# Patient Record
Sex: Male | Born: 2005 | Hispanic: Yes | Marital: Single
Health system: Southern US, Community
[De-identification: ages and names within clinical notes are randomized; demographics above are authoritative.]

---
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
## 2023-01-15 ENCOUNTER — Other Ambulatory Visit: Payer: Self-pay

## 2023-01-15 ENCOUNTER — Inpatient Hospital Stay (HOSPITAL_COMMUNITY)
Admission: EM | Admit: 2023-01-15 | Discharge: 2023-01-17 | DRG: 399 | Disposition: A | Discharge status: Home / Self Care | Payer: Self-pay | Attending: Surgery | Admitting: Surgery

## 2023-01-15 ENCOUNTER — Encounter (HOSPITAL_COMMUNITY): Payer: Self-pay | Admitting: Emergency Medicine

## 2023-01-15 DIAGNOSIS — Z91013 Allergy to seafood: Secondary | ICD-10-CM

## 2023-01-15 DIAGNOSIS — K3580 Unspecified acute appendicitis: Principal | ICD-10-CM

## 2023-01-15 DIAGNOSIS — K660 Peritoneal adhesions (postprocedural) (postinfection): Secondary | ICD-10-CM | POA: Diagnosis present

## 2023-01-15 DIAGNOSIS — K37 Unspecified appendicitis: Secondary | ICD-10-CM | POA: Diagnosis present

## 2023-01-15 DIAGNOSIS — K35211 Acute appendicitis with generalized peritonitis, with perforation and abscess: Principal | ICD-10-CM | POA: Diagnosis present

## 2023-01-15 LAB — COMPREHENSIVE METABOLIC PANEL
ALT: 23 U/L (ref 0–44)
AST: 23 U/L (ref 15–41)
Albumin: 4.3 g/dL (ref 3.5–5.0)
Alkaline Phosphatase: 61 U/L (ref 38–126)
Anion gap: 11 (ref 5–15)
BUN: 8 mg/dL (ref 6–20)
CO2: 24 mmol/L (ref 22–32)
Calcium: 8.9 mg/dL (ref 8.9–10.3)
Chloride: 98 mmol/L (ref 98–111)
Creatinine, Ser: 0.95 mg/dL (ref 0.61–1.24)
GFR, Estimated: >60 mL/min (ref >60)
Glucose, Bld: 104 mg/dL — ABNORMAL HIGH (ref 70–99)
Potassium: 3.7 mmol/L (ref 3.5–5.1)
Sodium: 133 mmol/L — ABNORMAL LOW (ref 135–145)
Total Bilirubin: 1.3 mg/dL — ABNORMAL HIGH (ref 0.3–1.2)
Total Protein: 7.6 g/dL (ref 6.5–8.1)

## 2023-01-15 LAB — URINALYSIS, ROUTINE W REFLEX MICROSCOPIC
Bilirubin Urine: NEGATIVE
Glucose, UA: NEGATIVE mg/dL
Hgb urine dipstick: NEGATIVE
Ketones, ur: NEGATIVE mg/dL
Leukocytes,Ua: NEGATIVE
Nitrite: NEGATIVE
Protein, ur: NEGATIVE mg/dL
Specific Gravity, Urine: 1.014 (ref 1.005–1.030)
pH: 6 (ref 5.0–8.0)

## 2023-01-15 LAB — LIPASE, BLOOD: Lipase: 28 U/L (ref 11–51)

## 2023-01-15 LAB — CBC
HCT: 46.7 % (ref 39.0–52.0)
Hemoglobin: 16 g/dL (ref 13.0–17.0)
MCH: 30.7 pg (ref 26.0–34.0)
MCHC: 34.3 g/dL (ref 30.0–36.0)
MCV: 89.6 fL (ref 80.0–100.0)
Platelets: 214 10*3/uL (ref 150–400)
RBC: 5.21 MIL/uL (ref 4.22–5.81)
RDW: 12 % (ref 11.5–15.5)
WBC: 17.2 10*3/uL — ABNORMAL HIGH (ref 4.0–10.5)
nRBC: 0 % (ref 0.0–0.2)

## 2023-01-15 NOTE — ED Triage Notes (Signed)
Pt in with cramping lower abdominal pain x 2 days. Worse today, denies n/v/d

## 2023-01-16 ENCOUNTER — Encounter (HOSPITAL_COMMUNITY): Admission: EM | Disposition: A | Discharge status: Home / Self Care | Payer: Self-pay | Source: Home / Self Care

## 2023-01-16 ENCOUNTER — Observation Stay (HOSPITAL_COMMUNITY): Payer: Self-pay | Admitting: Anesthesiology

## 2023-01-16 ENCOUNTER — Encounter (HOSPITAL_COMMUNITY): Payer: Self-pay | Admitting: General Surgery

## 2023-01-16 ENCOUNTER — Emergency Department (HOSPITAL_COMMUNITY): Payer: Self-pay

## 2023-01-16 DIAGNOSIS — K3533 Acute appendicitis with perforation and localized peritonitis, with abscess: Secondary | ICD-10-CM

## 2023-01-16 DIAGNOSIS — K37 Unspecified appendicitis: Secondary | ICD-10-CM | POA: Diagnosis present

## 2023-01-16 HISTORY — PX: LAPAROSCOPIC APPENDECTOMY: SHX408

## 2023-01-16 LAB — CBC
HCT: 41.4 % (ref 39.0–52.0)
Hemoglobin: 14.5 g/dL (ref 13.0–17.0)
MCH: 30.9 pg (ref 26.0–34.0)
MCHC: 35 g/dL (ref 30.0–36.0)
MCV: 88.1 fL (ref 80.0–100.0)
Platelets: 160 10*3/uL (ref 150–400)
RBC: 4.7 MIL/uL (ref 4.22–5.81)
RDW: 12 % (ref 11.5–15.5)
WBC: 9.8 10*3/uL (ref 4.0–10.5)
nRBC: 0 % (ref 0.0–0.2)

## 2023-01-16 LAB — BASIC METABOLIC PANEL
Anion gap: 12 (ref 5–15)
BUN: 7 mg/dL (ref 6–20)
CO2: 19 mmol/L — ABNORMAL LOW (ref 22–32)
Calcium: 8.3 mg/dL — ABNORMAL LOW (ref 8.9–10.3)
Chloride: 102 mmol/L (ref 98–111)
Creatinine, Ser: 0.8 mg/dL (ref 0.61–1.24)
GFR, Estimated: >60 mL/min (ref >60)
Glucose, Bld: 98 mg/dL (ref 70–99)
Potassium: 3.5 mmol/L (ref 3.5–5.1)
Sodium: 133 mmol/L — ABNORMAL LOW (ref 135–145)

## 2023-01-16 LAB — HIV ANTIBODY (ROUTINE TESTING W REFLEX): HIV Screen 4th Generation wRfx: NONREACTIVE

## 2023-01-16 SURGERY — APPENDECTOMY, LAPAROSCOPIC
Anesthesia: General

## 2023-01-16 MED ORDER — DIPHENHYDRAMINE HCL 25 MG PO CAPS: 25.0000 mg | ORAL_CAPSULE | Freq: Four times a day (QID) | ORAL | Status: DC | PRN

## 2023-01-16 MED ORDER — ONDANSETRON HCL 4 MG/2ML IJ SOLN
INTRAMUSCULAR | Status: DC | PRN
  Administered 2023-01-16: 4 mg via INTRAVENOUS

## 2023-01-16 MED ORDER — DEXMEDETOMIDINE HCL IN NACL 80 MCG/20ML IV SOLN
INTRAVENOUS | Status: AC
  Filled 2023-01-16: qty 20

## 2023-01-16 MED ORDER — DEXAMETHASONE SODIUM PHOSPHATE 10 MG/ML IJ SOLN
INTRAMUSCULAR | Status: AC
  Filled 2023-01-16: qty 1

## 2023-01-16 MED ORDER — BUPIVACAINE-EPINEPHRINE (PF) 0.25% -1:200000 IJ SOLN
INTRAMUSCULAR | Status: AC
  Filled 2023-01-16: qty 30

## 2023-01-16 MED ORDER — SODIUM CHLORIDE 0.9 % IV SOLN
2.0000 g | Freq: Once | INTRAVENOUS | Status: AC
  Administered 2023-01-16: 2 g via INTRAVENOUS
  Filled 2023-01-16: qty 20

## 2023-01-16 MED ORDER — ONDANSETRON HCL 4 MG/2ML IJ SOLN: 4.0000 mg | Freq: Four times a day (QID) | INTRAMUSCULAR | Status: DC | PRN

## 2023-01-16 MED ORDER — IOHEXOL 350 MG/ML SOLN
75.0000 mL | Freq: Once | INTRAVENOUS | Status: AC | PRN
  Administered 2023-01-16: 75 mL via INTRAVENOUS

## 2023-01-16 MED ORDER — MIDAZOLAM HCL 2 MG/2ML IJ SOLN
INTRAMUSCULAR | Status: AC
  Filled 2023-01-16: qty 2

## 2023-01-16 MED ORDER — ROCURONIUM BROMIDE 10 MG/ML (PF) SYRINGE
PREFILLED_SYRINGE | INTRAVENOUS | Status: DC | PRN
  Administered 2023-01-16: 60 mg via INTRAVENOUS

## 2023-01-16 MED ORDER — FENTANYL CITRATE (PF) 100 MCG/2ML IJ SOLN
25.0000 ug | INTRAMUSCULAR | Status: DC | PRN
  Administered 2023-01-16: 25 ug via INTRAVENOUS

## 2023-01-16 MED ORDER — LACTATED RINGERS IV BOLUS: 1000.0000 mL | Freq: Once | INTRAVENOUS | Status: DC

## 2023-01-16 MED ORDER — AMOXICILLIN-POT CLAVULANATE 875-125 MG PO TABS
1.0000 | ORAL_TABLET | Freq: Two times a day (BID) | ORAL | Status: DC
  Administered 2023-01-16 – 2023-01-17 (×2): 1 via ORAL
  Filled 2023-01-16 (×2): qty 1

## 2023-01-16 MED ORDER — PROPOFOL 10 MG/ML IV BOLUS
INTRAVENOUS | Status: AC
  Filled 2023-01-16: qty 20

## 2023-01-16 MED ORDER — DEXMEDETOMIDINE HCL IN NACL 80 MCG/20ML IV SOLN
INTRAVENOUS | Status: DC | PRN
  Administered 2023-01-16: 4 ug via INTRAVENOUS
  Administered 2023-01-16: 8 ug via INTRAVENOUS

## 2023-01-16 MED ORDER — FENTANYL CITRATE (PF) 250 MCG/5ML IJ SOLN
INTRAMUSCULAR | Status: DC | PRN
  Administered 2023-01-16 (×3): 50 ug via INTRAVENOUS

## 2023-01-16 MED ORDER — HYDROMORPHONE HCL 1 MG/ML IJ SOLN: 0.5000 mg | INTRAMUSCULAR | Status: DC | PRN

## 2023-01-16 MED ORDER — OXYCODONE HCL 5 MG PO TABS
5.0000 mg | ORAL_TABLET | Freq: Four times a day (QID) | ORAL | Status: DC | PRN
  Administered 2023-01-16 – 2023-01-17 (×2): 10 mg via ORAL
  Filled 2023-01-16 (×2): qty 2

## 2023-01-16 MED ORDER — ACETAMINOPHEN 325 MG PO TABS
650.0000 mg | ORAL_TABLET | Freq: Four times a day (QID) | ORAL | Status: DC | PRN
  Administered 2023-01-16: 650 mg via ORAL
  Filled 2023-01-16: qty 2

## 2023-01-16 MED ORDER — SUGAMMADEX SODIUM 200 MG/2ML IV SOLN
INTRAVENOUS | Status: DC | PRN
  Administered 2023-01-16: 100 mg via INTRAVENOUS
  Administered 2023-01-16: 50 mg via INTRAVENOUS
  Administered 2023-01-16: 100 mg via INTRAVENOUS

## 2023-01-16 MED ORDER — ACETAMINOPHEN 650 MG RE SUPP: 650.0000 mg | Freq: Four times a day (QID) | RECTAL | Status: DC | PRN

## 2023-01-16 MED ORDER — METRONIDAZOLE 500 MG/100ML IV SOLN
500.0000 mg | Freq: Once | INTRAVENOUS | Status: AC
  Administered 2023-01-16: 500 mg via INTRAVENOUS
  Filled 2023-01-16: qty 100

## 2023-01-16 MED ORDER — ORAL CARE MOUTH RINSE
15.0000 mL | Freq: Once | OROMUCOSAL | Status: AC
  Administered 2023-01-16: 15 mL via OROMUCOSAL

## 2023-01-16 MED ORDER — OXYCODONE HCL 5 MG/5ML PO SOLN: 5.0000 mg | Freq: Once | ORAL | Status: DC | PRN

## 2023-01-16 MED ORDER — DIPHENHYDRAMINE HCL 50 MG/ML IJ SOLN: 25.0000 mg | Freq: Four times a day (QID) | INTRAMUSCULAR | Status: DC | PRN

## 2023-01-16 MED ORDER — ONDANSETRON HCL 4 MG/2ML IJ SOLN
4.0000 mg | Freq: Once | INTRAMUSCULAR | Status: AC
  Administered 2023-01-16: 4 mg via INTRAVENOUS
  Filled 2023-01-16: qty 2

## 2023-01-16 MED ORDER — 0.9 % SODIUM CHLORIDE (POUR BTL) OPTIME
TOPICAL | Status: DC | PRN
  Administered 2023-01-16: 1000 mL

## 2023-01-16 MED ORDER — FENTANYL CITRATE (PF) 250 MCG/5ML IJ SOLN
INTRAMUSCULAR | Status: AC
  Filled 2023-01-16: qty 5

## 2023-01-16 MED ORDER — MIDAZOLAM HCL 2 MG/2ML IJ SOLN
INTRAMUSCULAR | Status: DC | PRN
  Administered 2023-01-16 (×2): 1 mg via INTRAVENOUS

## 2023-01-16 MED ORDER — ACETAMINOPHEN 325 MG PO TABS
650.0000 mg | ORAL_TABLET | Freq: Four times a day (QID) | ORAL | Status: DC
  Administered 2023-01-16 – 2023-01-17 (×3): 650 mg via ORAL
  Filled 2023-01-16 (×3): qty 2

## 2023-01-16 MED ORDER — ONDANSETRON 4 MG PO TBDP: 4.0000 mg | ORAL_TABLET | Freq: Four times a day (QID) | ORAL | Status: DC | PRN

## 2023-01-16 MED ORDER — ONDANSETRON HCL 4 MG/2ML IJ SOLN
INTRAMUSCULAR | Status: AC
  Filled 2023-01-16: qty 2

## 2023-01-16 MED ORDER — DEXAMETHASONE SODIUM PHOSPHATE 10 MG/ML IJ SOLN
INTRAMUSCULAR | Status: DC | PRN
  Administered 2023-01-16: 5 mg via INTRAVENOUS

## 2023-01-16 MED ORDER — LACTATED RINGERS IV SOLN: INTRAVENOUS | Status: DC

## 2023-01-16 MED ORDER — ONDANSETRON HCL 4 MG/2ML IJ SOLN: 4.0000 mg | Freq: Once | INTRAMUSCULAR | Status: DC | PRN

## 2023-01-16 MED ORDER — MEPERIDINE HCL 25 MG/ML IJ SOLN: 6.2500 mg | INTRAMUSCULAR | Status: DC | PRN

## 2023-01-16 MED ORDER — OXYCODONE HCL 5 MG PO TABS
5.0000 mg | ORAL_TABLET | ORAL | Status: DC | PRN
  Administered 2023-01-16: 5 mg via ORAL
  Filled 2023-01-16: qty 1

## 2023-01-16 MED ORDER — ACETAMINOPHEN 325 MG PO TABS: 325.0000 mg | ORAL_TABLET | ORAL | Status: DC | PRN

## 2023-01-16 MED ORDER — SODIUM CHLORIDE 0.9 % IV BOLUS
1000.0000 mL | Freq: Once | INTRAVENOUS | Status: AC
  Administered 2023-01-16: 1000 mL via INTRAVENOUS

## 2023-01-16 MED ORDER — OXYCODONE HCL 5 MG PO TABS: 5.0000 mg | ORAL_TABLET | Freq: Once | ORAL | Status: DC | PRN

## 2023-01-16 MED ORDER — CHLORHEXIDINE GLUCONATE 0.12 % MT SOLN: 15.0000 mL | Freq: Once | OROMUCOSAL | Status: AC

## 2023-01-16 MED ORDER — MORPHINE SULFATE (PF) 4 MG/ML IV SOLN
4.0000 mg | INTRAVENOUS | Status: DC | PRN
  Administered 2023-01-16: 4 mg via INTRAVENOUS
  Filled 2023-01-16: qty 1

## 2023-01-16 MED ORDER — DOCUSATE SODIUM 100 MG PO CAPS
100.0000 mg | ORAL_CAPSULE | Freq: Two times a day (BID) | ORAL | Status: DC
  Administered 2023-01-16 – 2023-01-17 (×3): 100 mg via ORAL
  Filled 2023-01-16 (×3): qty 1

## 2023-01-16 MED ORDER — ROCURONIUM BROMIDE 10 MG/ML (PF) SYRINGE
PREFILLED_SYRINGE | INTRAVENOUS | Status: AC
  Filled 2023-01-16: qty 10

## 2023-01-16 MED ORDER — BUPIVACAINE-EPINEPHRINE 0.25% -1:200000 IJ SOLN
INTRAMUSCULAR | Status: DC | PRN
  Administered 2023-01-16: 16 mL

## 2023-01-16 MED ORDER — TRAMADOL HCL 50 MG PO TABS: 50.0000 mg | ORAL_TABLET | Freq: Four times a day (QID) | ORAL | Status: DC | PRN

## 2023-01-16 MED ORDER — METRONIDAZOLE 500 MG/100ML IV SOLN
500.0000 mg | Freq: Two times a day (BID) | INTRAVENOUS | Status: DC
  Administered 2023-01-16: 500 mg via INTRAVENOUS
  Filled 2023-01-16: qty 100

## 2023-01-16 MED ORDER — FENTANYL CITRATE (PF) 100 MCG/2ML IJ SOLN
INTRAMUSCULAR | Status: AC
  Filled 2023-01-16: qty 2

## 2023-01-16 MED ORDER — LIDOCAINE 2% (20 MG/ML) 5 ML SYRINGE
INTRAMUSCULAR | Status: AC
  Filled 2023-01-16: qty 5

## 2023-01-16 MED ORDER — SODIUM CHLORIDE 0.9 % IR SOLN
Status: DC | PRN
  Administered 2023-01-16: 1000 mL

## 2023-01-16 MED ORDER — PROPOFOL 10 MG/ML IV BOLUS
INTRAVENOUS | Status: DC | PRN
  Administered 2023-01-16: 40 mg via INTRAVENOUS
  Administered 2023-01-16: 150 mg via INTRAVENOUS
  Administered 2023-01-16: 50 mg via INTRAVENOUS

## 2023-01-16 MED ORDER — MORPHINE SULFATE (PF) 4 MG/ML IV SOLN
4.0000 mg | Freq: Once | INTRAVENOUS | Status: AC
  Administered 2023-01-16: 4 mg via INTRAVENOUS
  Filled 2023-01-16: qty 1

## 2023-01-16 MED ORDER — POTASSIUM CHLORIDE IN NACL 20-0.45 MEQ/L-% IV SOLN
INTRAVENOUS | Status: DC
  Filled 2023-01-16 (×2): qty 1000

## 2023-01-16 MED ORDER — ACETAMINOPHEN 160 MG/5ML PO SOLN: 325.0000 mg | ORAL | Status: DC | PRN

## 2023-01-16 MED ORDER — LIDOCAINE 2% (20 MG/ML) 5 ML SYRINGE
INTRAMUSCULAR | Status: DC | PRN
  Administered 2023-01-16: 100 mg via INTRAVENOUS

## 2023-01-16 MED ORDER — PHENYLEPHRINE 80 MCG/ML (10ML) SYRINGE FOR IV PUSH (FOR BLOOD PRESSURE SUPPORT)
PREFILLED_SYRINGE | INTRAVENOUS | Status: DC | PRN
  Administered 2023-01-16 (×2): 80 ug via INTRAVENOUS

## 2023-01-16 SURGICAL SUPPLY — 48 items
ADH SKN CLS APL DERMABOND .7 (GAUZE/BANDAGES/DRESSINGS) ×1
APL PRP STRL LF DISP 70% ISPRP (MISCELLANEOUS) ×1
APPLIER CLIP 5 13 M/L LIGAMAX5 (MISCELLANEOUS)
APR CLP MED LRG 5 ANG JAW (MISCELLANEOUS)
BAG COUNTER SPONGE SURGICOUNT (BAG) ×2 IMPLANT
BAG SPNG CNTER NS LX DISP (BAG) ×1
BLADE CLIPPER SURG (BLADE) IMPLANT
CANISTER SUCT 3000ML PPV (MISCELLANEOUS) ×2 IMPLANT
CHLORAPREP W/TINT 26 (MISCELLANEOUS) ×2 IMPLANT
CLIP APPLIE 5 13 M/L LIGAMAX5 (MISCELLANEOUS) IMPLANT
COVER SURGICAL LIGHT HANDLE (MISCELLANEOUS) ×2 IMPLANT
CUTTER FLEX LINEAR 45M (STAPLE) ×2 IMPLANT
DERMABOND ADVANCED .7 DNX12 (GAUZE/BANDAGES/DRESSINGS) ×2 IMPLANT
ELECT REM PT RETURN 9FT ADLT (ELECTROSURGICAL) ×1
ELECTRODE REM PT RTRN 9FT ADLT (ELECTROSURGICAL) ×2 IMPLANT
GLOVE BIO SURGEON STRL SZ 6 (GLOVE) ×2 IMPLANT
GLOVE INDICATOR 6.5 STRL GRN (GLOVE) ×2 IMPLANT
GOWN STRL REUS W/ TWL LRG LVL3 (GOWN DISPOSABLE) ×6 IMPLANT
GOWN STRL REUS W/TWL LRG LVL3 (GOWN DISPOSABLE) ×3
GRASPER SUT TROCAR 14GX15 (MISCELLANEOUS) ×2 IMPLANT
IRRIG SUCT STRYKERFLOW 2 WTIP (MISCELLANEOUS) ×1
IRRIGATION SUCT STRKRFLW 2 WTP (MISCELLANEOUS) ×2 IMPLANT
KIT BASIN OR (CUSTOM PROCEDURE TRAY) ×2 IMPLANT
KIT TURNOVER KIT B (KITS) ×2 IMPLANT
NDL INSUFFLATION 14GA 120MM (NEEDLE) ×2 IMPLANT
NEEDLE INSUFFLATION 14GA 120MM (NEEDLE) ×1 IMPLANT
NS IRRIG 1000ML POUR BTL (IV SOLUTION) ×2 IMPLANT
PAD ARMBOARD 7.5X6 YLW CONV (MISCELLANEOUS) ×4 IMPLANT
RELOAD 45 VASCULAR/THIN (ENDOMECHANICALS) IMPLANT
RELOAD STAPLE 45 2.5 WHT GRN (ENDOMECHANICALS) IMPLANT
RELOAD STAPLE 45 3.5 BLU ETS (ENDOMECHANICALS) IMPLANT
RELOAD STAPLE TA45 3.5 REG BLU (ENDOMECHANICALS) ×1 IMPLANT
SCISSORS LAP 5X35 DISP (ENDOMECHANICALS) IMPLANT
SET TUBE SMOKE EVAC HIGH FLOW (TUBING) ×2 IMPLANT
SHEARS HARMONIC ACE PLUS 36CM (ENDOMECHANICALS) ×2 IMPLANT
SLEEVE Z-THREAD 5X100MM (TROCAR) ×2 IMPLANT
SPECIMEN JAR SMALL (MISCELLANEOUS) ×2 IMPLANT
SUT MNCRL AB 4-0 PS2 18 (SUTURE) ×2 IMPLANT
SYS BAG RETRIEVAL 10MM (BASKET) ×1
SYSTEM BAG RETRIEVAL 10MM (BASKET) ×2 IMPLANT
TOWEL GREEN STERILE FF (TOWEL DISPOSABLE) ×2 IMPLANT
TRAY FOLEY W/BAG SLVR 16FR (SET/KITS/TRAYS/PACK) ×1
TRAY FOLEY W/BAG SLVR 16FR ST (SET/KITS/TRAYS/PACK) ×2 IMPLANT
TRAY LAPAROSCOPIC MC (CUSTOM PROCEDURE TRAY) ×2 IMPLANT
TROCAR Z THREAD OPTICAL 12X100 (TROCAR) ×2 IMPLANT
TROCAR Z-THREAD OPTICAL 5X100M (TROCAR) ×2 IMPLANT
WARMER LAPAROSCOPE (MISCELLANEOUS) ×2 IMPLANT
WATER STERILE IRR 1000ML POUR (IV SOLUTION) ×2 IMPLANT

## 2023-01-16 NOTE — Anesthesia Preprocedure Evaluation (Addendum)
Anesthesia Evaluation  Patient identified by MRN, date of birth, ID band Patient awake    Reviewed: Allergy & Precautions, H&P , NPO status , Patient's Chart, lab work & pertinent test results  Airway Mallampati: I  TM Distance: >3 FB Neck ROM: Full    Dental no notable dental hx. (+) Teeth Intact, Dental Advisory Given   Pulmonary neg pulmonary ROS   Pulmonary exam normal breath sounds clear to auscultation       Cardiovascular Exercise Tolerance: Good negative cardio ROS Normal cardiovascular exam Rhythm:Regular Rate:Normal     Neuro/Psych negative neurological ROS  negative psych ROS   GI/Hepatic negative GI ROS, Neg liver ROS,,,  Endo/Other  negative endocrine ROS    Renal/GU negative Renal ROS  negative genitourinary   Musculoskeletal negative musculoskeletal ROS (+)    Abdominal   Peds negative pediatric ROS (+)  Hematology negative hematology ROS (+)   Anesthesia Other Findings   Reproductive/Obstetrics negative OB ROS                             Anesthesia Physical Anesthesia Plan  ASA: 2  Anesthesia Plan: General   Post-op Pain Management: Toradol IV (intra-op)* and Ofirmev IV (intra-op)*   Induction: Intravenous and Cricoid pressure planned  PONV Risk Score and Plan: 2 and Treatment may vary due to age or medical condition  Airway Management Planned: Oral ETT  Additional Equipment: None  Intra-op Plan:   Post-operative Plan: Extubation in OR  Informed Consent: I have reviewed the patients History and Physical, chart, labs and discussed the procedure including the risks, benefits and alternatives for the proposed anesthesia with the patient or authorized representative who has indicated his/her understanding and acceptance.       Plan Discussed with: Anesthesiologist and CRNA  Anesthesia Plan Comments: (  )        Anesthesia Quick Evaluation

## 2023-01-16 NOTE — Transfer of Care (Signed)
Immediate Anesthesia Transfer of Care Note  Patient: Ethan Gonzalez  Procedure(s) Performed: APPENDECTOMY LAPAROSCOPIC  Patient Location: PACU  Anesthesia Type:General  Level of Consciousness: drowsy and patient cooperative  Airway & Oxygen Therapy: Patient Spontanous Breathing  Post-op Assessment: Report given to RN and Post -op Vital signs reviewed and stable  Post vital signs: Reviewed and stable  Last Vitals:  Vitals Value Taken Time  BP 114/71 01/16/23 0931  Temp    Pulse 106 01/16/23 0933  Resp 23 01/16/23 0933  SpO2 97 % 01/16/23 0933  Vitals shown include unvalidated device data.  Last Pain:  Vitals:   01/16/23 0737  TempSrc: Oral  PainSc:       Patients Stated Pain Goal: 3 (01/16/23 0721)  Complications: No notable events documented.

## 2023-01-16 NOTE — Anesthesia Postprocedure Evaluation (Signed)
Anesthesia Post Note  Patient: Ethan Gonzalez  Procedure(s) Performed: APPENDECTOMY LAPAROSCOPIC     Patient location during evaluation: PACU Anesthesia Type: General Level of consciousness: awake and alert Pain management: pain level controlled Vital Signs Assessment: post-procedure vital signs reviewed and stable Respiratory status: spontaneous breathing, nonlabored ventilation, respiratory function stable and patient connected to nasal cannula oxygen Cardiovascular status: blood pressure returned to baseline and stable Postop Assessment: no apparent nausea or vomiting Anesthetic complications: no   No notable events documented.  Last Vitals:  Vitals:   01/16/23 1007 01/16/23 1020  BP: (!) 100/54 109/65  Pulse: 91 93  Resp: 15 20  Temp: 36.4 C 36.8 C  SpO2: 95% 98%    Last Pain:  Vitals:   01/16/23 1031  TempSrc:   PainSc: 2                  Owens Hara

## 2023-01-16 NOTE — Op Note (Signed)
Operative Report  Ethan Gonzalez 17 y.o. male  [id number]  [id number]  01/16/2023  Surgeon: Berna Bue    Assistant: none   Procedure performed: Laparoscopic Appendectomy   Preop diagnosis: Acute appendicitis   Post-op diagnosis/intraop findings: Acute appendicitis with localized abscess along the appendiceal mesentery in addition to localized and pelvic purulent peritonitis with moderate volume pus in the pelvis.   Specimens: appendix   EBL: minimal   Complications: none   Description of procedure: After obtaining informed consent the patient was brought to the operating room. Antibiotics were administered. SCD's were applied. General endotracheal anesthesia was initiated and a formal time-out was performed. Foley catheter was inserted which is removed at the end of the case. The abdomen was prepped and draped in the usual sterile fashion and the abdomen was entered using an infraumbilical Veress needle and insufflated to 15 mmHg. A 5 mm trocar and camera were then introduced, the abdomen was inspected and there is no evidence of injury from our entry. A suprapubic 5 mm trocar was placed 3 fingerbreadths above the pubic symphysis and to the left of midline, and a left lower quadrant 12 mm trocar were introduced under direct visualization following infiltration with local. The patient was then placed in Trendelenburg and rotated to the left and the small bowel was reflected cephalad.  Purulent adhesions between the distal small bowel and appendix were gently bluntly swept away.  The appendix is found to be extremely inflamed with purulent rind and evolving abscess adjacent to the proximal appendix extending into the appendiceal mesentery with localized gangrenous changes but no overt perforation.  Moderate volume of pus was evacuated from the pelvis and mesoappendix.  The appendix was partially retrocecal. A combination of blunt dissection and harmonic scalpel were used to free it  of its retroperitoneal attachments and to mobilize the distal aspect of the cecum from its lateral attachments. Great care was taken to ensure no injury to surrounding retroperitoneal structures, cecum or terminal ileum. The mesoappendix was divided with the harmonic scalpel, isolating the base of the appendix. Hemostasis was ensured. A blue load 45mm endoGIA stapler was used to transect the appendix from the cecum, taking a cuff of viable cecum with the specimen. The appendix was placed in an Endo Catch bag and removed through our 12 mm trocar site. The staple line was reinspected and confirmed to be intact, hemostatic, and viable. The small bowel was run several feet from the ilececal valve proximally with no other abnormalities identified. The omentum was brought down over the staple line.  The 12mm trocar site in the left lower quadrant was closed with 2 simple interrupted 0 vicryls in the fascia under direct visualization using a PMI device. The abdomen was desufflated and all trocars removed. The skin incisions were closed with subcuticular 4-0 monocryl and Dermabond. The patient was awakened, extubated and transported to the recovery room in stable condition.    All counts were correct at the completion of the case.

## 2023-01-16 NOTE — ED Provider Notes (Signed)
[place] EMERGENCY DEPARTMENT AT [facility] Provider Note   CSN: [id number] Arrival date & time: 01/15/23  2129     History  Chief Complaint  Patient presents with   Abdominal Pain    Ethan Gonzalez is a 17 y.o. male.  Presents with complaints of lower abdominal pain for two days. Pain worsening. No nausea, vomiting, diarrhea.       Home Medications Prior to Admission medications   Medication Sig Start Date End Date Taking? Authorizing Provider  EPINEPHrine 0.3 mg/0.3 mL IJ SOAJ injection Inject 0.3 mg into the muscle as needed for up to 4 doses for anaphylaxis. 06/03/21   Stryffeler, Jonathon Jordan, NP      Allergies    Shrimp [shellfish allergy]    Review of Systems   Review of Systems  Physical Exam Updated Vital Signs BP 124/74   Pulse 90   Temp 100 F (37.8 C) (Oral)   Resp 18   Wt 54.4 kg   SpO2 100%  Physical Exam Vitals and nursing note reviewed.  Constitutional:      General: He is not in acute distress.    Appearance: He is well-developed.  HENT:     Head: Normocephalic and atraumatic.     Mouth/Throat:     Mouth: Mucous membranes are moist.  Eyes:     General: Vision grossly intact. Gaze aligned appropriately.     Extraocular Movements: Extraocular movements intact.     Conjunctiva/sclera: Conjunctivae normal.  Cardiovascular:     Rate and Rhythm: Normal rate and regular rhythm.     Pulses: Normal pulses.     Heart sounds: Normal heart sounds, S1 normal and S2 normal. No murmur heard.    No friction rub. No gallop.  Pulmonary:     Effort: Pulmonary effort is normal. No respiratory distress.     Breath sounds: Normal breath sounds.  Abdominal:     Palpations: Abdomen is soft.     Tenderness: There is abdominal tenderness in the right lower quadrant. There is guarding. There is no rebound.     Hernia: No hernia is present.  Musculoskeletal:        General: No swelling.     Cervical back: Full passive range of motion  without pain, normal range of motion and neck supple. No pain with movement, spinous process tenderness or muscular tenderness. Normal range of motion.     Right lower leg: No edema.     Left lower leg: No edema.  Skin:    General: Skin is warm and dry.     Capillary Refill: Capillary refill takes less than 2 seconds.     Findings: No ecchymosis, erythema, lesion or wound.  Neurological:     Mental Status: He is alert and oriented to person, place, and time.     GCS: GCS eye subscore is 4. GCS verbal subscore is 5. GCS motor subscore is 6.     Cranial Nerves: Cranial nerves 2-12 are intact.     Sensory: Sensation is intact.     Motor: Motor function is intact. No weakness or abnormal muscle tone.     Coordination: Coordination is intact.  Psychiatric:        Mood and Affect: Mood normal.        Speech: Speech normal.        Behavior: Behavior normal.     ED Results / Procedures / Treatments   Labs (all labs ordered are listed, but only abnormal  results are displayed) Labs Reviewed  COMPREHENSIVE METABOLIC PANEL - Abnormal; Notable for the following components:      Result Value   Sodium 133 (*)    Glucose, Bld 104 (*)    Total Bilirubin 1.3 (*)    All other components within normal limits  CBC - Abnormal; Notable for the following components:   WBC 17.2 (*)    All other components within normal limits  LIPASE, BLOOD  URINALYSIS, ROUTINE W REFLEX MICROSCOPIC    EKG None  Radiology CT ABDOMEN PELVIS W CONTRAST  Result Date: 01/16/2023 CLINICAL DATA:  Right lower quadrant pain. EXAM: CT ABDOMEN AND PELVIS WITH CONTRAST TECHNIQUE: Multidetector CT imaging of the abdomen and pelvis was performed using the standard protocol following bolus administration of intravenous contrast. RADIATION DOSE REDUCTION: This exam was performed according to the departmental dose-optimization program which includes automated exposure control, adjustment of the mA and/or kV according to patient  size and/or use of iterative reconstruction technique. CONTRAST:  75mL OMNIPAQUE IOHEXOL 350 MG/ML SOLN COMPARISON:  None Available. FINDINGS: Lower chest: No acute abnormality. Hepatobiliary: No focal liver abnormality is seen. No calcified gallstones, gallbladder wall thickening, or biliary dilatation. Pancreas: No abnormality. Spleen: No abnormality.  No splenomegaly. Adrenals/Urinary Tract: Adrenal glands are unremarkable. Kidneys are normal, without focal lesion or hydronephrosis. There is contrast in the renal collecting systems which would obscure intrarenal stones if present. No ureteral stone is suspected. Bladder is unremarkable. Stomach/Bowel: Unremarkable gastric wall and small bowel. Abnormal appendix consistent with appendicitis with fluid dilatation to 1.3 cm and surrounding inflammatory reaction. There is a 1.2 cm stone in the proximal lumen and 2 smaller subcentimeter stones in the more distal appendix. There is no evidence of appendiceal rupture or abscess. The large bowel wall is unremarkable. Vascular/Lymphatic: No significant vascular findings are present. No enlarged abdominal or pelvic lymph nodes. Reproductive: Prostate is unremarkable. Other: There is a small amount of free fluid in the posterior deep pelvis which is probably reactive fluid related to the appendicitis. There is no free air, free hemorrhage or abscess. There are no incarcerated hernias. Musculoskeletal: No acute or significant osseous findings. IMPRESSION: 1. Acute appendicitis without evidence of rupture or abscess. Appendicoliths in the proximal and distal appendix. 2. Small amount of free fluid in the posterior deep pelvis which is probably reactive fluid related to the appendicitis. No abscess or free air. 3. Critical Value/emergent results were called by telephone at the time of interpretation on 01/16/2023 at 12:48 am to provider [facility] , who verbally acknowledged these results. Electronically Signed   By:  Almira Bar M.D.   On: 01/16/2023 00:53    Procedures Procedures    Medications Ordered in ED Medications  morphine (PF) 4 MG/ML injection 4 mg (has no administration in time range)  ondansetron (ZOFRAN) injection 4 mg (has no administration in time range)  cefTRIAXone (ROCEPHIN) 2 g in sodium chloride 0.9 % 100 mL IVPB (has no administration in time range)    And  metroNIDAZOLE (FLAGYL) IVPB 500 mg (has no administration in time range)  sodium chloride 0.9 % bolus 1,000 mL (has no administration in time range)  iohexol (OMNIPAQUE) 350 MG/ML injection 75 mL (75 mLs Intravenous Contrast Given 01/16/23 0032)    ED Course/ Medical Decision Making/ A&P                             Medical Decision Making Amount and/or Complexity of  Data Reviewed Labs: ordered. Radiology: ordered.  Risk Prescription drug management.   Differential Diagnosis considered includes, but not limited to: Appendicitis; colitis; diverticulitis; bowel obstruction; cystitis; nephrolithiasis; pyelonephritis.  Patient with 2 days of lower abd pain. Exam with diffuse lower abd pain, but guarding in RLQ. CT positive for appendicolith and acute appendicitis. Gen surgery will evaluate.        Final Clinical Impression(s) / ED Diagnoses Final diagnoses:  Acute appendicitis, unspecified acute appendicitis type    Rx / DC Orders ED Discharge Orders     None         Gilda Crease, MD 01/16/23 [phone]

## 2023-01-16 NOTE — ED Notes (Signed)
ED TO INPATIENT HANDOFF REPORT  ED Nurse Name and Phone #: [phone]  S Name/Age/Gender Ethan Gonzalez 17 y.o. male Room/Bed: H021C/H021C  Code Status   Code Status: Full Code  Home/SNF/Other Home Patient oriented to: self, place, time, and situation Is this baseline? Yes   Triage Complete: Triage complete  Chief Complaint Appendicitis [K37]  Triage Note Pt in with cramping lower abdominal pain x 2 days. Worse today, denies n/v/d   Allergies Allergies  Allergen Reactions   Shrimp [Shellfish Allergy] Itching and Swelling    Level of Care/Admitting Diagnosis ED Disposition     ED Disposition  Admit   Condition  --   Comment  Hospital Area: [facility] [[id number]]  Level of Care: Med-Surg [16]  May place patient in observation at [facility] or [facility] if equivalent level of care is available:: No  Covid Evaluation: Asymptomatic - no recent exposure (last 10 days) testing not required  Diagnosis: Appendicitis [191478]  Admitting Physician: Violeta Gelinas [[id number]]  Attending Physician: CCS, MD [[id number]  Bed request comments: 6N or 5N          B Medical/Surgery History Past Medical History:  Diagnosis Date   Elevated blood pressure reading 07/03/2019   History reviewed. No pertinent surgical history.   A IV Location/Drains/Wounds Patient Lines/Drains/Airways Status     Active Line/Drains/Airways     Name Placement date Placement time Site Days   Peripheral IV 01/16/23 20 G Right Antecubital 01/16/23  0011  Antecubital  less than 1            Intake/Output Last 24 hours  Intake/Output Summary (Last 24 hours) at 01/16/2023 0225 Last data filed at 01/16/2023 0224 Gross per 24 hour  Intake 1200 ml  Output --  Net 1200 ml    Labs/Imaging Results for orders placed or performed during the hospital encounter of 01/15/23 (from the past 48 hour(s))  Lipase, blood     Status: None   Collection Time: 01/15/23  9:51 PM  Result  Value Ref Range   Lipase 28 11 - 51 U/L    Comment: Performed at [facility], [address] [place], [place] [address]  Comprehensive metabolic panel     Status: Abnormal   Collection Time: 01/15/23  9:51 PM  Result Value Ref Range   Sodium 133 (L) 135 - 145 mmol/L   Potassium 3.7 3.5 - 5.1 mmol/L   Chloride 98 98 - 111 mmol/L   CO2 24 22 - 32 mmol/L   Glucose, Bld 104 (H) 70 - 99 mg/dL    Comment: Glucose reference range applies only to samples taken after fasting for at least 8 hours.   BUN 8 6 - 20 mg/dL   Creatinine, Ser 1.30 0.61 - 1.24 mg/dL   Calcium 8.9 8.9 - 86.5 mg/dL   Total Protein 7.6 6.5 - 8.1 g/dL   Albumin 4.3 3.5 - 5.0 g/dL   AST 23 15 - 41 U/L   ALT 23 0 - 44 U/L   Alkaline Phosphatase 61 38 - 126 U/L   Total Bilirubin 1.3 (H) 0.3 - 1.2 mg/dL   GFR, Estimated >78 >46 mL/min    Comment: (NOTE) Calculated using the CKD-EPI Creatinine Equation (2021)    Anion gap 11 5 - 15    Comment: Performed at [facility], [address] [place], [place] [address]  CBC     Status: Abnormal   Collection Time: 01/15/23  9:51 PM  Result Value Ref Range   WBC 17.2 (H) 4.0 - 10.5 K/uL   RBC 5.21 4.22 - 5.81 MIL/uL   Hemoglobin 16.0 13.0 - 17.0 g/dL   HCT 40.9 81.1 - 91.4 %   MCV 89.6 80.0 - 100.0 fL   MCH 30.7 26.0 - 34.0 pg   MCHC 34.3 30.0 - 36.0 g/dL   RDW 78.2 95.6 - 21.3 %   Platelets 214 150 - 400 K/uL   nRBC 0.0 0.0 - 0.2 %    Comment: Performed at [facility], [address], [place], [place] [address]  Urinalysis, Routine w reflex microscopic -Urine, Clean Catch     Status: None   Collection Time: 01/15/23  9:52 PM  Result Value Ref Range   Color, Urine YELLOW YELLOW   APPearance CLEAR CLEAR   Specific Gravity, Urine 1.014 1.005 - 1.030   pH 6.0 5.0 - 8.0   Glucose, UA NEGATIVE NEGATIVE mg/dL   Hgb urine dipstick NEGATIVE NEGATIVE   Bilirubin Urine NEGATIVE NEGATIVE   Ketones, ur NEGATIVE NEGATIVE mg/dL   Protein, ur NEGATIVE NEGATIVE  mg/dL   Nitrite NEGATIVE NEGATIVE   Leukocytes,Ua NEGATIVE NEGATIVE    Comment: Performed at [facility], [address], [place], [place] [address]   CT ABDOMEN PELVIS W CONTRAST  Result Date: 01/16/2023 CLINICAL DATA:  Right lower quadrant pain. EXAM: CT ABDOMEN AND PELVIS WITH CONTRAST TECHNIQUE: Multidetector CT imaging of the abdomen and pelvis was performed using the standard protocol following bolus administration of intravenous contrast. RADIATION DOSE REDUCTION: This exam was performed according to the departmental dose-optimization program which includes automated exposure control, adjustment of the mA and/or kV according to patient size and/or use of iterative reconstruction technique. CONTRAST:  75mL OMNIPAQUE IOHEXOL 350 MG/ML SOLN COMPARISON:  None Available. FINDINGS: Lower chest: No acute abnormality. Hepatobiliary: No focal liver abnormality is seen. No calcified gallstones, gallbladder wall thickening, or biliary dilatation. Pancreas: No abnormality. Spleen: No abnormality.  No splenomegaly. Adrenals/Urinary Tract: Adrenal glands are unremarkable. Kidneys are normal, without focal lesion or hydronephrosis. There is contrast in the renal collecting systems which would obscure intrarenal stones if present. No ureteral stone is suspected. Bladder is unremarkable. Stomach/Bowel: Unremarkable gastric wall and small bowel. Abnormal appendix consistent with appendicitis with fluid dilatation to 1.3 cm and surrounding inflammatory reaction. There is a 1.2 cm stone in the proximal lumen and 2 smaller subcentimeter stones in the more distal appendix. There is no evidence of appendiceal rupture or abscess. The large bowel wall is unremarkable. Vascular/Lymphatic: No significant vascular findings are present. No enlarged abdominal or pelvic lymph nodes. Reproductive: Prostate is unremarkable. Other: There is a small amount of free fluid in the posterior deep pelvis which is probably reactive  fluid related to the appendicitis. There is no free air, free hemorrhage or abscess. There are no incarcerated hernias. Musculoskeletal: No acute or significant osseous findings. IMPRESSION: 1. Acute appendicitis without evidence of rupture or abscess. Appendicoliths in the proximal and distal appendix. 2. Small amount of free fluid in the posterior deep pelvis which is probably reactive fluid related to the appendicitis. No abscess or free air. 3. Critical Value/emergent results were called by telephone at the time of interpretation on 01/16/2023 at 12:48 am to provider [facility] , who verbally acknowledged these results. Electronically Signed   By: Almira Bar M.D.   On: 01/16/2023 00:53    Pending Labs [employer] (From admission, onward)     Start  Ordered   Signed and Held  HIV Antibody (routine testing w rflx)  (HIV Antibody (Routine testing w reflex) panel)  Once,   R        Signed and Held   Signed and Held  Basic metabolic panel  Tomorrow morning,   R        Signed and Held   Signed and Held  CBC  Tomorrow morning,   R        Signed and Held            Vitals/Pain Today's Vitals   01/15/23 2146 01/16/23 0057 01/16/23 0122 01/16/23 0200  BP:  124/74    Pulse:  90    Resp:  18    Temp:  100 F (37.8 C)    TempSrc:  Oral    SpO2:  100%    Weight: 54.4 kg  61.2 kg   Height:   5\' 7"  (1.702 m)   PainSc: 7    8     Isolation Precautions No active isolations  Medications Medications  iohexol (OMNIPAQUE) 350 MG/ML injection 75 mL (75 mLs Intravenous Contrast Given 01/16/23 0032)  morphine (PF) 4 MG/ML injection 4 mg (4 mg Intravenous Given 01/16/23 0108)  ondansetron (ZOFRAN) injection 4 mg (4 mg Intravenous Given 01/16/23 0107)  cefTRIAXone (ROCEPHIN) 2 g in sodium chloride 0.9 % 100 mL IVPB (0 g Intravenous Stopped 01/16/23 0147)    And  metroNIDAZOLE (FLAGYL) IVPB 500 mg (0 mg Intravenous Stopped 01/16/23 0222)  sodium chloride 0.9 % bolus 1,000 mL (0 mLs  Intravenous Stopped 01/16/23 0224)    Mobility walks     Focused Assessments GI   R Recommendations: See Admitting Provider Note  Report given to:   Additional Notes: Please call with any questions

## 2023-01-16 NOTE — H&P (Signed)
Ethan Gonzalez Elonda Husky is an 17 y.o. male.   Chief Complaint: RLQ pain HPI: 17yo M developed crampy abdominal pain on Saturday.  The pain persisted and gradually localized to the right lower quadrant.  He had associated nausea but no vomiting.  He came to the emergency department for evaluation.  Workup revealed leukocytosis and CT scan of the abdomen pelvis showed acute appendicitis.  I was asked to see him for surgical management.  No past medical history.  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  Past Medical History:  Diagnosis Date   Elevated blood pressure reading 07/03/2019    History reviewed. No pertinent surgical history.  Family History  Problem Relation Age of Onset   Diabetes Maternal Grandmother    Social History:  reports that he is a non-smoker but has been exposed to tobacco smoke. He has never used smokeless tobacco. He reports that he does not drink alcohol. No history on file for drug use.  Allergies:  Allergies  Allergen Reactions   Shrimp [Shellfish Allergy] Itching and Swelling    (Not in a hospital admission)   Results for orders placed or performed during the hospital encounter of 01/15/23 (from the past 48 hour(s))  Lipase, blood     Status: None   Collection Time: 01/15/23  9:51 PM  Result Value Ref Range   Lipase 28 11 - 51 U/L    Comment: Performed at [facility], [address] [place], [place] [address]  Comprehensive metabolic panel     Status: Abnormal   Collection Time: 01/15/23  9:51 PM  Result Value Ref Range   Sodium 133 (L) 135 - 145 mmol/L   Potassium 3.7 3.5 - 5.1 mmol/L   Chloride 98 98 - 111 mmol/L   CO2 24 22 - 32 mmol/L   Glucose, Bld 104 (H) 70 - 99 mg/dL    Comment: Glucose reference range applies only to samples taken after fasting for at least 8 hours.   BUN 8 6 - 20 mg/dL   Creatinine, Ser 1.91 0.61 - 1.24 mg/dL   Calcium 8.9 8.9 - 47.8 mg/dL   Total Protein 7.6 6.5 -  8.1 g/dL   Albumin 4.3 3.5 - 5.0 g/dL   AST 23 15 - 41 U/L   ALT 23 0 - 44 U/L   Alkaline Phosphatase 61 38 - 126 U/L   Total Bilirubin 1.3 (H) 0.3 - 1.2 mg/dL   GFR, Estimated >29 >56 mL/min    Comment: (NOTE) Calculated using the CKD-EPI Creatinine Equation (2021)    Anion gap 11 5 - 15    Comment: Performed at [facility], [address], [place], [place] [address]  CBC     Status: Abnormal   Collection Time: 01/15/23  9:51 PM  Result Value Ref Range   WBC 17.2 (H) 4.0 - 10.5 K/uL   RBC 5.21 4.22 - 5.81 MIL/uL   Hemoglobin 16.0 13.0 - 17.0 g/dL   HCT 65.7 84.6 - 96.2 %   MCV 89.6 80.0 - 100.0 fL   MCH 30.7 26.0 - 34.0 pg   MCHC 34.3 30.0 - 36.0 g/dL   RDW 95.2 84.1 - 32.4 %   Platelets 214 150 - 400 K/uL   nRBC 0.0 0.0 - 0.2 %    Comment: Performed at [facility], [address], [place], [place] [address]  Urinalysis, Routine w reflex microscopic -Urine, Clean Catch  Status: None   Collection Time: 01/15/23  9:52 PM  Result Value Ref Range   Color, Urine YELLOW YELLOW   APPearance CLEAR CLEAR   Specific Gravity, Urine 1.014 1.005 - 1.030   pH 6.0 5.0 - 8.0   Glucose, UA NEGATIVE NEGATIVE mg/dL   Hgb urine dipstick NEGATIVE NEGATIVE   Bilirubin Urine NEGATIVE NEGATIVE   Ketones, ur NEGATIVE NEGATIVE mg/dL   Protein, ur NEGATIVE NEGATIVE mg/dL   Nitrite NEGATIVE NEGATIVE   Leukocytes,Ua NEGATIVE NEGATIVE    Comment: Performed at [facility], [address], [place], [place] [address]   CT ABDOMEN PELVIS W CONTRAST  Result Date: 01/16/2023 CLINICAL DATA:  Right lower quadrant pain. EXAM: CT ABDOMEN AND PELVIS WITH CONTRAST TECHNIQUE: Multidetector CT imaging of the abdomen and pelvis was performed using the standard protocol following bolus administration of intravenous contrast. RADIATION DOSE REDUCTION: This exam was performed according to the departmental dose-optimization program which includes automated exposure control, adjustment of the mA  and/or kV according to patient size and/or use of iterative reconstruction technique. CONTRAST:  75mL OMNIPAQUE IOHEXOL 350 MG/ML SOLN COMPARISON:  None Available. FINDINGS: Lower chest: No acute abnormality. Hepatobiliary: No focal liver abnormality is seen. No calcified gallstones, gallbladder wall thickening, or biliary dilatation. Pancreas: No abnormality. Spleen: No abnormality.  No splenomegaly. Adrenals/Urinary Tract: Adrenal glands are unremarkable. Kidneys are normal, without focal lesion or hydronephrosis. There is contrast in the renal collecting systems which would obscure intrarenal stones if present. No ureteral stone is suspected. Bladder is unremarkable. Stomach/Bowel: Unremarkable gastric wall and small bowel. Abnormal appendix consistent with appendicitis with fluid dilatation to 1.3 cm and surrounding inflammatory reaction. There is a 1.2 cm stone in the proximal lumen and 2 smaller subcentimeter stones in the more distal appendix. There is no evidence of appendiceal rupture or abscess. The large bowel wall is unremarkable. Vascular/Lymphatic: No significant vascular findings are present. No enlarged abdominal or pelvic lymph nodes. Reproductive: Prostate is unremarkable. Other: There is a small amount of free fluid in the posterior deep pelvis which is probably reactive fluid related to the appendicitis. There is no free air, free hemorrhage or abscess. There are no incarcerated hernias. Musculoskeletal: No acute or significant osseous findings. IMPRESSION: 1. Acute appendicitis without evidence of rupture or abscess. Appendicoliths in the proximal and distal appendix. 2. Small amount of free fluid in the posterior deep pelvis which is probably reactive fluid related to the appendicitis. No abscess or free air. 3. Critical Value/emergent results were called by telephone at the time of interpretation on 01/16/2023 at 12:48 am to provider [facility] , who verbally acknowledged these  results. Electronically Signed   By: Almira Bar M.D.   On: 01/16/2023 00:53    Review of Systems  Constitutional:  Positive for appetite change.  HENT: Negative.    Eyes: Negative.   Respiratory: Negative.    Cardiovascular: Negative.   Gastrointestinal:  Positive for abdominal pain and nausea.  Endocrine: Negative.   Genitourinary: Negative.   Musculoskeletal: Negative.   Allergic/Immunologic: Negative.   Neurological: Negative.   Hematological: Negative.   Psychiatric/Behavioral: Negative.      Blood pressure 124/74, pulse 90, temperature 100 F (37.8 C), temperature source Oral, resp. rate 18, height 5\' 7"  (1.702 m), weight 61.2 kg, SpO2 100 %. Physical Exam HENT:     Mouth/Throat:     Mouth: Mucous membranes are moist.  Cardiovascular:     Rate and Rhythm: Normal rate and regular rhythm.  Heart sounds: Normal heart sounds.  Pulmonary:     Effort: Pulmonary effort is normal.     Breath sounds: Normal breath sounds. No wheezing.  Abdominal:     General: Abdomen is flat.     Palpations: Abdomen is soft.     Tenderness: There is abdominal tenderness in the right lower quadrant. There is guarding. There is no rebound.  Skin:    General: Skin is warm.     Capillary Refill: Capillary refill takes less than 2 seconds.  Neurological:     Mental Status: He is alert and oriented to person, place, and time.  Psychiatric:        Mood and Affect: Mood normal.      Assessment/Plan Acute appendicitis -IV Rocephin and Flagyl.  Plan laparoscopic appendectomy later today by Dr. Fredricka Bonine.  I discussed the procedure, risks, and benefits with him and his mother.  I discussed the expected postoperative course.  He is agreeable.  Liz Malady, MD 01/16/2023, 1:50 AM

## 2023-01-16 NOTE — Anesthesia Procedure Notes (Signed)
Procedure Name: Intubation Date/Time: 01/16/2023 8:13 AM  Performed by: Audie Pinto, CRNAPre-anesthesia Checklist: Patient identified, Emergency Drugs available, Suction available and Patient being monitored Patient Re-evaluated:Patient Re-evaluated prior to induction Oxygen Delivery Method: Circle system utilized Preoxygenation: Pre-oxygenation with 100% oxygen Induction Type: IV induction Ventilation: Mask ventilation without difficulty Laryngoscope Size: Mac and 4 Grade View: Grade I Tube type: Oral Tube size: 7.0 mm Number of attempts: 1 Airway Equipment and Method: Stylet and Oral airway Placement Confirmation: ETT inserted through vocal cords under direct vision, positive ETCO2 and breath sounds checked- equal and bilateral Secured at: 21 cm Tube secured with: Tape Dental Injury: Teeth and Oropharynx as per pre-operative assessment

## 2023-01-16 NOTE — Progress Notes (Signed)
Patient evaluated this afternoon for possible discharge. He states he does feel better but still with some lower abdominal pain. Prefers to stay overnight. Will plan DC tomorrow if clinically progressing appropriately.

## 2023-01-16 NOTE — Interval H&P Note (Signed)
History and Physical Interval Note:  01/16/2023 7:46 AM  Ethan Gonzalez  has presented today for surgery, with the diagnosis of appendicitis.  The various methods of treatment have been discussed with the patient and family. After consideration of risks, benefits and other options for treatment, the patient has consented to  Procedure(s): APPENDECTOMY LAPAROSCOPIC (N/A) as a surgical intervention.  The patient's history has been reviewed, patient examined, no change in status, stable for surgery.  I have reviewed the patient's chart and labs.  Questions were answered to the patient's satisfaction.     Lateasha Breuer Lollie Sails

## 2023-01-16 NOTE — Progress Notes (Signed)
Pt was taken by transport for surgery. Was given a paper to call report to Lina Sar at [phone] to give report was informed to give report to short stay who says they don't know if they. Gave information to on coming shift to give report.

## 2023-01-17 ENCOUNTER — Encounter (HOSPITAL_COMMUNITY): Payer: Self-pay | Admitting: Surgery

## 2023-01-17 ENCOUNTER — Other Ambulatory Visit: Payer: Self-pay

## 2023-01-17 LAB — SURGICAL PATHOLOGY

## 2023-01-17 MED ORDER — OXYCODONE HCL 5 MG PO TABS: 5.0000 mg | ORAL_TABLET | Freq: Four times a day (QID) | ORAL | 0 refills | Status: AC | PRN

## 2023-01-17 MED ORDER — ACETAMINOPHEN 325 MG PO TABS: 650.0000 mg | ORAL_TABLET | Freq: Four times a day (QID) | ORAL | Status: AC | PRN

## 2023-01-17 MED ORDER — IBUPROFEN 600 MG PO TABS: 600.0000 mg | ORAL_TABLET | Freq: Three times a day (TID) | ORAL | 0 refills | Status: AC | PRN

## 2023-01-17 MED ORDER — AMOXICILLIN-POT CLAVULANATE 875-125 MG PO TABS: 1.0000 | ORAL_TABLET | Freq: Two times a day (BID) | ORAL | 0 refills | Status: AC

## 2023-01-17 MED ORDER — IBUPROFEN 600 MG PO TABS
600.0000 mg | ORAL_TABLET | Freq: Three times a day (TID) | ORAL | Status: DC
  Administered 2023-01-17: 600 mg via ORAL
  Filled 2023-01-17: qty 1

## 2023-01-17 NOTE — Discharge Instructions (Signed)
CIRUGIA LAPAROSCOPICA: INSTRUCCIONES DE POST OPERATORIO.  Revise siempre los documentos que le entreguen en el lugar donde se ha hecho la cirugia.  SI USTED NECESITA DOCUMENTOS DE INCAPACIDAD (DISABLE) O DE PERMISO FAMILAR (FAMILY LEAVE) NECESITA TRAERLOS A LA OFICINA PARA QUE SEAN PROCESADOS. NO  SE LOS DE A SU DOCTOR. A su alta del hospital se le dara una receta para controlar el dolor. Tomela como ha sido recetada, si la necesita. Si no la necesita puede tomar, Acetaminofen (Tylenol) o Ibuprofen (Advil) para aliviar dolor moderado. Continue tomando el resto de sus medicinas. Si necesita rellenar la receta, llame a la farmacia. ellos contactan a nuestra oficina pidiendo autorizacion. Este tipo de receta no pueden ser rellenadas despues de las  5pm o durante los fines de semana. Con relacion a la dieta: debe ser ligera los primeros dias despues que llege a la casa. Ejemplo: sopas y galleticas. Tome bastante liquido esos dias. La mayoria de los pacientes padecen de inflamacion y cambio de coloracion de la piel alrededor de las incisiones. esto toma dias en resolver.  pnerse una bolsa de hielo en el area affectada ayuda..  Es comun tambien tener un poco de estrenimiento si esta tomado medicinas para el dolor. incremente la cantidad de liquidos a tomar y puede tomar (Colace) esto previene el problema. Si ya tiene estrenimiento, es decir no ha defecado en 48 horas, puede tomar un laxativo (Milk of Magnesia or Miralax) uselo como el paquete le explica.  A menos que se le diga algo diferente. Remueva el bendaje a las 24-48 horas despues dela cirugia. y puede banarse en la ducha sin ningun problema. usted puede tener steri-strips (pequenas curitas transparentes en la piel puesta encima de la incision)  Estas banditas strips should be left on the skin for 7-10 days.   Si su cirujano puso pegamento encima de la incision usted puede banarse bajo la ducha en 24 horas. Este pegamento empezara a caerse en las  proximas 2-3 semanas. Si le pusieron suturas o presillas (grapos) estos seran quitados en su proxima cita en la oficina. . ACTIVIDADES:  Puede hacer actividad ligera.  Como caminar , subir escaleras y poco a poco irlas incrementando tanto como las tolere. Puede tener relaciones sexuales cuando sea comfortable. No carge objetos pesados o haga esfuerzos que no sean aprovados por su doctor. Puede manejar en cuanto no esta tomando medicamentos fuertes (narcoticos) para el dolor, pueda abrochar confortablemente el cinturon de seguridad, y pueda maniobrar y usar los pedales de su vehiculo con seguridad. PUEDE REGRESAR A TRABAJAR  Debe ver a su doctor para una cita de seguimiento en 2-3 semanas despues de la cirugia.   CUANDO LLAMAR A SU MEDICO: FIEBRE mayor de  101.0 No produccion de orina. Sangramiento continue de la herida Incremento de dolor, enrojecimientio o drenaje de la herida (incision) Incremento de dolor abdominal.  The clinic staff is available to answer your questions during regular business hours.  Please don't hesitate to call and ask to speak to one of the nurses for clinical concerns.  If you have a medical emergency, go to the nearest emergency room or call 911.  A surgeon from [facility] is always on call at the hospital. [address], [address], [place], [place]  [address] ? [address] [address], [place], [place]   [address] [phone] ? [phone] ? FAX [phone] Web site: [email or web address]  

## 2023-01-17 NOTE — Discharge Summary (Signed)
[facility] Discharge Summary   Patient ID: Ethan Gonzalez MRN: [id number] DOB/AGE: 12/26/2005 17 y.o.  Admit date: 01/15/2023 Discharge date: 01/17/2023  Admitting Diagnosis: Acute appendicitis   Discharge Diagnosis Acute appendicitis with abscess S/P laparoscopic appendectomy   Consultants None   Imaging: CT ABDOMEN PELVIS W CONTRAST  Result Date: 01/16/2023 CLINICAL DATA:  Right lower quadrant pain. EXAM: CT ABDOMEN AND PELVIS WITH CONTRAST TECHNIQUE: Multidetector CT imaging of the abdomen and pelvis was performed using the standard protocol following bolus administration of intravenous contrast. RADIATION DOSE REDUCTION: This exam was performed according to the departmental dose-optimization program which includes automated exposure control, adjustment of the mA and/or kV according to patient size and/or use of iterative reconstruction technique. CONTRAST:  75mL OMNIPAQUE IOHEXOL 350 MG/ML SOLN COMPARISON:  None Available. FINDINGS: Lower chest: No acute abnormality. Hepatobiliary: No focal liver abnormality is seen. No calcified gallstones, gallbladder wall thickening, or biliary dilatation. Pancreas: No abnormality. Spleen: No abnormality.  No splenomegaly. Adrenals/Urinary Tract: Adrenal glands are unremarkable. Kidneys are normal, without focal lesion or hydronephrosis. There is contrast in the renal collecting systems which would obscure intrarenal stones if present. No ureteral stone is suspected. Bladder is unremarkable. Stomach/Bowel: Unremarkable gastric wall and small bowel. Abnormal appendix consistent with appendicitis with fluid dilatation to 1.3 cm and surrounding inflammatory reaction. There is a 1.2 cm stone in the proximal lumen and 2 smaller subcentimeter stones in the more distal appendix. There is no evidence of appendiceal rupture or abscess. The large bowel wall is unremarkable. Vascular/Lymphatic: No significant vascular findings are present. No  enlarged abdominal or pelvic lymph nodes. Reproductive: Prostate is unremarkable. Other: There is a small amount of free fluid in the posterior deep pelvis which is probably reactive fluid related to the appendicitis. There is no free air, free hemorrhage or abscess. There are no incarcerated hernias. Musculoskeletal: No acute or significant osseous findings. IMPRESSION: 1. Acute appendicitis without evidence of rupture or abscess. Appendicoliths in the proximal and distal appendix. 2. Small amount of free fluid in the posterior deep pelvis which is probably reactive fluid related to the appendicitis. No abscess or free air. 3. Critical Value/emergent results were called by telephone at the time of interpretation on 01/16/2023 at 12:48 am to provider [facility] , who verbally acknowledged these results. Electronically Signed   By: Almira Bar M.D.   On: 01/16/2023 00:53    Procedures Dr. Phylliss Blakes (01/16/23) - Laparoscopic Appendectomy  Hospital Course:  Patient is a 17 year old male who presented to the ED with abdominal pain.  Workup showed appendicitis.  Patient was admitted and underwent procedure listed above.  Tolerated procedure well and was transferred to the floor.  Diet was advanced as tolerated.  On POD1, the patient was voiding well, tolerating diet, ambulating well, pain well controlled, vital signs stable, incisions c/d/i and felt stable for discharge home.  Patient will follow up in our office in 3 weeks and knows to call with questions or concerns.  He will call to confirm appointment date/time.    Physical Exam: General:  Alert, NAD, pleasant, comfortable Abd:  Soft, ND, mild tenderness, incisions C/D/I  I or a member of my team have reviewed this patient in the Controlled Substance Database.   Allergies as of 01/17/2023       Reactions   Shrimp [shellfish Allergy] Itching, Swelling        Medication List     TAKE these medications    acetaminophen 325 MG  tablet Commonly known as: TYLENOL Take 2 tablets (650 mg total) by mouth every 6 (six) hours as needed for mild pain or fever.   amoxicillin-clavulanate 875-125 MG tablet Commonly known as: AUGMENTIN Take 1 tablet by mouth every 12 (twelve) hours for 4 days.   ibuprofen 600 MG tablet Commonly known as: ADVIL Take 1 tablet (600 mg total) by mouth every 8 (eight) hours as needed for moderate pain. Take with food   oxyCODONE 5 MG immediate release tablet Commonly known as: Oxy IR/ROXICODONE Take 1 tablet (5 mg total) by mouth every 6 (six) hours as needed for severe pain.          Follow-up Information     [place], Hedda Slade, [place]. Schedule an appointment as soon as possible for a visit in 3 week(s).   Specialty: General Surgery Why: Estamos programando una cita de seguimiento en 3 semanas. Por favor llame para confirmar la fecha y hora de la cita. Llegue 30 minutos antes de la hora de la cita para English as a second language teacher. Contact information: [address] [place] [address] [facility] [place] [address] [phone]                 Signed: Juliet Rude , [facility] 01/17/2023, 8:06 AM Please see Amion for pager number during day hours 7:00am-4:30pm

## 2023-01-17 NOTE — TOC Transition Note (Signed)
Transition of Care [facility]) - CM/SW Discharge Note   Patient Details  Name: Ethan Gonzalez MRN: [id number] Date of Birth: 04/20/2006  [facility] CM/SW Contact:  Leone Haven, RN Phone Number: 01/17/2023, 10:38 AM   Clinical Narrative:    Patient is for dc today, patient has transport home. NCM assisted with Match Letter for medications.  Patient has no other needs.          Patient Goals and CMS Choice      Discharge Placement                         Discharge Plan and Services Additional resources added to the After Visit Summary for                                       Social Determinants of Health (SDOH) Interventions SDOH Screenings   Depression (PHQ2-9): Low Risk  (06/03/2021)  Tobacco Use: Medium Risk (01/17/2023)     Readmission Risk Interventions     No data to display

## 2023-01-25 ENCOUNTER — Encounter: Payer: Self-pay | Admitting: Pediatrics

## 2023-01-25 ENCOUNTER — Ambulatory Visit (INDEPENDENT_AMBULATORY_CARE_PROVIDER_SITE_OTHER): Payer: Self-pay | Admitting: Pediatrics

## 2023-01-25 ENCOUNTER — Other Ambulatory Visit (HOSPITAL_COMMUNITY)
Admission: RE | Admit: 2023-01-25 | Discharge: 2023-01-25 | Disposition: A | Discharge status: Home / Self Care | Payer: Self-pay | Source: Ambulatory Visit | Attending: Pediatrics | Admitting: Pediatrics

## 2023-01-25 VITALS — BP 128/78 | HR 70 | Ht 65.35 in | Wt 120.2 lb

## 2023-01-25 DIAGNOSIS — Z1339 Encounter for screening examination for other mental health and behavioral disorders: Secondary | ICD-10-CM

## 2023-01-25 DIAGNOSIS — Z91018 Allergy to other foods: Secondary | ICD-10-CM

## 2023-01-25 DIAGNOSIS — Z113 Encounter for screening for infections with a predominantly sexual mode of transmission: Secondary | ICD-10-CM | POA: Insufficient documentation

## 2023-01-25 DIAGNOSIS — Z1331 Encounter for screening for depression: Secondary | ICD-10-CM

## 2023-01-25 DIAGNOSIS — Z68.41 Body mass index (BMI) pediatric, 5th percentile to less than 85th percentile for age: Secondary | ICD-10-CM

## 2023-01-25 DIAGNOSIS — Z114 Encounter for screening for human immunodeficiency virus [HIV]: Secondary | ICD-10-CM

## 2023-01-25 DIAGNOSIS — Z0000 Encounter for general adult medical examination without abnormal findings: Secondary | ICD-10-CM

## 2023-01-25 LAB — POCT RAPID HIV: Rapid HIV, POC: NEGATIVE

## 2023-01-25 MED ORDER — EPINEPHRINE 0.3 MG/0.3ML IJ SOAJ
0.3000 mg | INTRAMUSCULAR | 0 refills | Status: AC | PRN
Start: 2023-01-25 — End: ?

## 2023-01-25 NOTE — Patient Instructions (Signed)
   As your medical provider, it is important to me that you continue to receive high-quality primary care services as you transition to adulthood.  After the age of 29, you can no longer be seen at the [facility] for your primary care health services.   Below is a list of adult medicine practices that are currently accepting new patients.  Please reach out to one of these practices to schedule a new patient appointment as soon as possible.  Please be aware that you will not be able to be seen at my office after your 22nd birthday.    Adult Primary Care Clinics Name Criteria Services   [facility]  Address: [address] [place], [place] [address]  Phone: [phone] Hours: Monday - Friday 9 AM -6 PM  Types of insurance accepted:  Commercial insurance [facility] (orange card) [employer] Uninsured  Language services:  Video and phone interpreters available   Ages 28 and older    Adult primary care Onsite pharmacy Integrated behavioral health Financial assistance counseling Walk-in hours for established patients  Financial assistance counseling hours: Tuesdays 2:00PM - 5:00PM  Thursday 8:30AM - 4:30PM  Space is limited, 10 on Tuesday and 20 on Thursday on a first come, first serve basis  Name Criteria Services   [facility]  Address: [address] [place], [place] [address]  Phone: [phone]  Hours: Monday - Friday 8:30 AM - 5 PM  Types of insurance accepted:  Nurse, learning disability Medicaid Medicare Uninsured  Language services:  Video and phone interpreters available   All ages - newborn to adult   Primary care for all ages (children and adults) Integrated behavioral health Nutritionist Financial assistance counseling   Name Criteria Services   [facility]  Located on the ground floor of  [facility]  Address: [address]  [place],  [place]  [address]  Phone: [phone]  Hours: Monday - Friday 8:15 AM - 5 PM  Types of insurance accepted:  Commercial insurance Medicaid Medicare Uninsured  Language services:  Video and phone interpreters available   Ages 55 and older   Adult primary care Nutritionist Certified Diabetes Educator  Integrated behavioral health Financial assistance counseling   Name Criteria Services   [facility]  Address: [address] [place], [place] [address]  Phone: [phone]  Hours: Monday - Friday 8:30 AM - 5 PM    Types of insurance accepted:  Nurse, learning disability Medicaid Medicare Uninsured  Language services:  Video and phone interpreters available   All ages - newborn to adult   Primary care for all ages (children and adults) Integrated behavioral health Financial assistance counseling

## 2023-01-25 NOTE — Progress Notes (Signed)
Adolescent Well Care Visit Ethan Gonzalez is a 17 y.o. male who is here for well care.    PCP:  Jones Broom, MD   History was provided by the patient.  Interval history: Had laparoscopic appendectomy 1 week ago, doing well. Some pain with walking and standing. No vomiting. No fever. Will follow-up with surgery in 3 weeks.   Current Issues: Current concerns include none.   Nutrition: Nutrition/Eating Behaviors: yes Supplements/ Vitamins: none  Exercise/ Media: Play any Sports?/ Exercise: Basketball and goes to gym.  Screen Time:  < 2 hours Media Rules or Monitoring?: patient is an adult  Sleep:  Sleep: 8 hours  Social Screening: Lives with:  Mom, step dad and sibings x 6. He is the oldest.  Parental relations:  good Activities, Work, and Regulatory affairs officer?: helps in home Concerns regarding behavior with peers?  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  Wants to get license in Stage manager.  Confidential Social History: Tobacco?  no Secondhand smoke exposure?  no Drugs/ETOH?  no  Sexually Active?  yes   Pregnancy Prevention: discussed  Safe at home, in school & in relationships?  Yes Safe to self?  Yes   Screenings: Patient has a dental home:  yes but not up to date  The patient completed the Rapid Assessment for Adolescent Preventive Services screening questionnaire and the following topics were identified as risk factors and discussed: condom use and safety   In addition, the following topics were discussed as part of anticipatory guidance healthy eating, seatbelt use, weapon use, tobacco use, marijuana use, drug use, condom use, family problems, and screen time.  PHQ-9 completed and results indicated 1  Physical Exam:  Vitals:   01/25/23 0854  BP: 128/78  Pulse: 70  Weight: 120 lb 3.2 oz (54.5 kg)  Height: 5' 5.35" (1.66 m)   BP 128/78   Pulse 70   Ht 5' 5.35" (1.66 m)    Wt 120 lb 3.2 oz (54.5 kg)   BMI 19.79 kg/m  Body mass index: body mass index is 19.79 kg/m. Blood pressure %iles are not available for patients who are 18 years or older.  Hearing Screening   500Hz  1000Hz  2000Hz  4000Hz   Right ear 20 20 20 20   Left ear 20 20 20 20    Vision Screening   Right eye Left eye Both eyes  Without correction 20/20 20/25 20/16   With correction       General Appearance:   alert, oriented, no acute distress  HENT: Normocephalic, no obvious abnormality, conjunctiva clear  Mouth:   Normal appearing teeth, no obvious discoloration, dental caries, or dental caps  Neck:   Supple; thyroid: no enlargement, symmetric, no tenderness/mass/nodules  Chest Normal male  Lungs:   Clear to auscultation bilaterally, normal work of breathing  Heart:   Regular rate and rhythm, S1 and S2 normal, no murmurs;   Abdomen:   Soft, no mass, or organomegaly. Tender to palpation on left lower quandrant close to surgical incision. All 3 incisions (LLQ, umbilical, and suprapubic area) are healing well without any erythema or drainage.   GU normal male genitals, no testicular masses or hernia, Tanner V  Musculoskeletal:   Tone and strength strong and symmetrical, all extremities            Difficulty assessing spine due to patient unable to stand up straight due to abdominal pain from recent surgery. ?asymmetry as right  shoulder appears slightly higher than left when standing upright.     Lymphatic:   No cervical adenopathy  Skin/Hair/Nails:   Skin warm, dry and intact, no rashes, no bruises or petechiae  Neurologic:   Strength, gait, and coordination normal and age-appropriate     Assessment and Plan:   1. Encounter for general adult medical examination without abnormal findings  2. Body mass index, pediatric, 5th percentile to less than 85th percentile for age  39. History of food allergy - Discussed shrimp avoidance. Instructed on proper use of Epipen if needed. - EPINEPHrine  (EPIPEN 2-PAK) 0.3 mg/0.3 mL IJ SOAJ injection; Inject 0.3 mg into the muscle as needed for anaphylaxis. May repeat dose if no improvement in symptoms after about 10 mins. Call 911 immediately after using Epipen.  Dispense: 1 each; Refill: 0  4. Routine screening for STI (sexually transmitted infection) - POCT Rapid HIV - Urine cytology ancillary only  BMI is appropriate for age. Patient with a weight of 119 lbs on 01/15/23 and weight of 135 lb on 01/16/23. Weight today 01/25/23 is 120. Weight of 135 is likely an incorrect measurement as patient states that his weight has remained at around 120 for a couple of years now.  Hearing screening result:normal Vision screening result: normal  Counseling provided for all of the vaccine components  Orders Placed This Encounter  Procedures   POCT Rapid HIV   Discussed transitioning to adult provider. List of practices provided to patient.  Return in about 3 months (around 04/27/2023) for recheck back.. Patient to f/u with surgery 3 weeks post op. Patient to call and schedule appt.   Jones Broom, MD

## 2023-01-26 LAB — URINE CYTOLOGY ANCILLARY ONLY
Bacterial Vaginitis-Urine: NEGATIVE
Candida Urine: NEGATIVE
Chlamydia: NEGATIVE
Comment: NEGATIVE
Comment: NEGATIVE
Comment: NORMAL
Neisseria Gonorrhea: NEGATIVE
Trichomonas: NEGATIVE

## 2023-05-01 ENCOUNTER — Ambulatory Visit (INDEPENDENT_AMBULATORY_CARE_PROVIDER_SITE_OTHER): Payer: Self-pay | Admitting: Pediatrics

## 2023-05-01 VITALS — HR 62 | Temp 98.3°F | Wt 124.0 lb

## 2023-05-01 DIAGNOSIS — M545 Low back pain, unspecified: Secondary | ICD-10-CM

## 2023-05-01 DIAGNOSIS — Z23 Encounter for immunization: Secondary | ICD-10-CM

## 2023-05-01 DIAGNOSIS — Z13828 Encounter for screening for other musculoskeletal disorder: Secondary | ICD-10-CM

## 2023-05-01 NOTE — Progress Notes (Signed)
History was provided by the patient.  Ethan Gonzalez is a 17 y.o. male who is here for follow-up of back.    HPI:  17 yo here for follow-up to assess for scoliosis and back pain. At his last visit, he was a few days post op and had difficulty with forward bend test.  His back pain is described as lower back pain and occurs occurs after sitting for a while. He does not take anything for his pain and it resolves with change in position and rest. He is not limited by back pain.   The following portions of the patient's history were reviewed and updated as appropriate: allergies, current medications, past family history, past medical history, past social history, past surgical history, and problem list.  Physical Exam:  Pulse 62   Temp 98.3 F (36.8 C) (Oral)   Wt 124 lb (56.2 kg)   SpO2 98%   BMI 20.41 kg/m    General:   alert, cooperative, and appears stated age  Skin:   normal  Oral cavity:   lips, mucosa, and tongue normal; teeth and gums normal  Eyes:   sclerae white, pupils equal and reactive  Ears:   normal bilaterally  Nose: not examined  Neck:  supple  Lungs:  clear to auscultation bilaterally  Heart:   regular rate and rhythm, S1, S2 normal, no murmur, click, rub or gallop   Back:  Asymmetry noted on forward bend test with right scapula slightly elevated, also with chest asymmetry with left side more prominent that right.     Assessment/Plan:  1. Scoliosis concern - No prior scoliosis xray. Will obtain xray although not likely to progress given patient's age.  - If back pain persists and normal xray, consider PT as pain is intermittent and mild. - DG SCOLIOSIS EVAL COMPLETE SPINE 2 OR 3 VIEWS; Future  2. Need for influenza vaccination - Flu vaccine trivalent PF, 6mos and older(Flulaval,Afluria,Fluarix,Fluzone)   Jones Broom, MD  05/01/23
